# Patient Record
Sex: Female | Born: 1995 | Race: White | Hispanic: No | Marital: Single
Health system: Southern US, Community
[De-identification: ages and names within clinical notes are randomized; demographics above are authoritative.]

## PROBLEM LIST (undated history)

## (undated) DIAGNOSIS — R109 Unspecified abdominal pain: Secondary | ICD-10-CM

## (undated) DIAGNOSIS — H539 Unspecified visual disturbance: Secondary | ICD-10-CM

## (undated) DIAGNOSIS — R110 Nausea: Secondary | ICD-10-CM

## (undated) DIAGNOSIS — F329 Major depressive disorder, single episode, unspecified: Secondary | ICD-10-CM

## (undated) DIAGNOSIS — R1110 Vomiting, unspecified: Secondary | ICD-10-CM

## (undated) DIAGNOSIS — K2970 Gastritis, unspecified, without bleeding: Secondary | ICD-10-CM

## (undated) DIAGNOSIS — F32A Depression, unspecified: Secondary | ICD-10-CM

## (undated) DIAGNOSIS — E669 Obesity, unspecified: Secondary | ICD-10-CM

## (undated) HISTORY — DX: Nausea: R11.0

## (undated) HISTORY — DX: Vomiting, unspecified: R11.10

## (undated) HISTORY — DX: Morbid (severe) obesity due to excess calories: E66.01

## (undated) HISTORY — DX: Unspecified abdominal pain: R10.9

## (undated) HISTORY — PX: EYE SURGERY: SHX253

## (undated) HISTORY — PX: APPENDECTOMY: SHX54

---
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
## 2018-04-20 ENCOUNTER — Emergency Department (HOSPITAL_COMMUNITY)
Admission: EM | Admit: 2018-04-20 | Discharge: 2018-04-20 | Disposition: A | Discharge status: Home / Self Care | Payer: Self-pay | Attending: Emergency Medicine | Admitting: Emergency Medicine

## 2018-04-20 ENCOUNTER — Other Ambulatory Visit: Payer: Self-pay

## 2018-04-20 ENCOUNTER — Encounter (HOSPITAL_COMMUNITY): Payer: Self-pay | Admitting: Emergency Medicine

## 2018-04-20 ENCOUNTER — Emergency Department (HOSPITAL_COMMUNITY): Payer: Self-pay

## 2018-04-20 DIAGNOSIS — J111 Influenza due to unidentified influenza virus with other respiratory manifestations: Secondary | ICD-10-CM | POA: Insufficient documentation

## 2018-04-20 DIAGNOSIS — R69 Illness, unspecified: Secondary | ICD-10-CM

## 2018-04-20 DIAGNOSIS — Z79899 Other long term (current) drug therapy: Secondary | ICD-10-CM | POA: Insufficient documentation

## 2018-04-20 DIAGNOSIS — Z7722 Contact with and (suspected) exposure to environmental tobacco smoke (acute) (chronic): Secondary | ICD-10-CM | POA: Insufficient documentation

## 2018-04-20 MED ORDER — OSELTAMIVIR PHOSPHATE 75 MG PO CAPS: 75.0000 mg | ORAL_CAPSULE | Freq: Two times a day (BID) | ORAL | 0 refills | Status: AC

## 2018-04-20 MED ORDER — OSELTAMIVIR PHOSPHATE 75 MG PO CAPS
75.0000 mg | ORAL_CAPSULE | Freq: Once | ORAL | Status: AC
  Administered 2018-04-20: 75 mg via ORAL
  Filled 2018-04-20: qty 1

## 2018-04-20 MED ORDER — BENZONATATE 100 MG PO CAPS: 200.0000 mg | ORAL_CAPSULE | Freq: Three times a day (TID) | ORAL | 0 refills | Status: DC | PRN

## 2018-04-20 MED ORDER — BENZONATATE 100 MG PO CAPS
200.0000 mg | ORAL_CAPSULE | Freq: Three times a day (TID) | ORAL | 0 refills | Status: AC | PRN
Start: 2018-04-20 — End: ?

## 2018-04-20 NOTE — ED Triage Notes (Signed)
Cough, body aches, and chills since yesterday.

## 2018-04-20 NOTE — Discharge Instructions (Signed)
Your chest x-ray tonight is clear of any infection.  I suspect you do have the flu.  Go home and rest, make sure you are drinking plenty of fluids.  Take ibuprofen or Tylenol for fever reduction and body aches.  Take your next dose of Tamiflu tomorrow morning and take this medication twice daily until gone.  Get rechecked for any worsening symptoms.

## 2018-04-22 NOTE — ED Provider Notes (Signed)
[facility] EMERGENCY DEPARTMENT Provider Note   CSN: [id number] Arrival date & time: 04/20/18  2011     History   Chief Complaint Chief Complaint  Patient presents with  . Cough    HPI [name] is a 22 y.o. female.  The history is provided by the patient and a parent.  Cough  This is a new problem. The current episode started yesterday. The problem occurs every few minutes. The problem has not changed since onset.The cough is non-productive. The maximum temperature recorded prior to her arrival was 100 to 100.9 F (subjective fever including chills and sweats). Associated symptoms include chills, sweats and myalgias. Pertinent negatives include no chest pain, no headaches, no rhinorrhea, no sore throat, no shortness of breath and no wheezing. Treatments tried: She has taken Tylenol prior to arrival. The treatment provided mild relief. She is not a smoker. Her past medical history is significant for bronchitis. Her past medical history does not include pneumonia or asthma.    Past Medical History:  Diagnosis Date  . Abdominal pain   . Depression   . Gastritis   . Migraine   . Nausea   . Obesity   . Vision abnormalities    wears glasses  . Vomiting     Patient Active Problem List   Diagnosis Date Noted  . Problem with school attendance 10/31/2012  . Chronic headaches 10/31/2012  . Diarrhea 03/21/2012  . Nausea   . Depression 03/03/2012  . RUQ abdominal pain 02/23/2012    Past Surgical History:  Procedure Laterality Date  . APPENDECTOMY    . ESOPHAGOGASTRODUODENOSCOPY  04/01/2012   Procedure: ESOPHAGOGASTRODUODENOSCOPY (EGD);  Surgeon: Jon Gills, MD;  Location: [facility] OR;  Service: Gastroenterology;  Laterality: N/A;  . EYE SURGERY     bil .     OB History   None      Home Medications    Prior to Admission medications   Medication Sig Start Date End Date Taking? Authorizing Provider  benzonatate (TESSALON) 100 MG capsule Take 2 capsules (200 mg  total) by mouth 3 (three) times daily as needed. 04/20/18   Burgess Amor, PA-C  ibuprofen (ADVIL,MOTRIN) 200 MG tablet Take 400 mg by mouth every 6 (six) hours as needed for pain.    [provider]  medroxyPROGESTERone (DEPO-PROVERA) 150 MG/ML injection Inject 150 mg into the muscle every 3 (three) months.    [provider]  minocycline (MINOCIN,DYNACIN) 100 MG capsule Take 100 mg by mouth daily.     [provider]  oseltamivir (TAMIFLU) 75 MG capsule Take 1 capsule (75 mg total) by mouth every 12 (twelve) hours. 04/20/18   Burgess Amor, PA-C  OXcarbazepine (TRILEPTAL) 150 MG tablet Take 300 mg by mouth at bedtime. Take two capsules (300mg ) in the morning and three capsules (450 mg )at bedtime    [provider]  topiramate (TOPAMAX) 50 MG tablet Take 50-100 mg by mouth 2 (two) times daily. For migraine prevention. 1 in the morning and 2 at bedtime.    [provider]    Family History Family History  Problem Relation Age of Onset  . Diabetes Mother   . Cholelithiasis Mother   . Depression Mother   . Hypertension Mother   . Miscarriages / [place] Mother   . Diabetes Maternal Grandmother   . COPD Maternal Grandmother   . Hypertension Maternal Grandmother   . Cancer Maternal Grandfather   . COPD Maternal Grandfather   . Ulcers Maternal  Grandfather   . Pancreatitis Maternal Grandfather   . Hypertension Maternal Grandfather   . Ulcers Paternal Grandfather     Social History Social History   Tobacco Use  . Smoking status: Passive Smoke Exposure - Never Smoker  . Smokeless tobacco: Never Used  Substance Use Topics  . Alcohol use: No  . Drug use: No     Allergies   Patient has no known allergies.   Review of Systems Review of Systems  Constitutional: Positive for chills.  HENT: Negative for rhinorrhea and sore throat.   Respiratory: Positive for cough. Negative for shortness of breath and wheezing.   Cardiovascular: Negative  for chest pain.  Musculoskeletal: Positive for myalgias.  Neurological: Negative for headaches.     Physical Exam Updated Vital Signs BP (!) 158/79 (BP Location: Right Arm)   Pulse (!) 118   Temp 99.3 F (37.4 C) (Oral)   Resp 18   Ht 5\' 6"  (1.676 m)   Wt 97.1 kg   LMP 04/17/2018   SpO2 100%   BMI 34.54 kg/m   Physical Exam  Constitutional: She is oriented to person, place, and time. She appears well-developed and well-nourished.  HENT:  Head: Normocephalic and atraumatic.  Right Ear: Tympanic membrane and ear canal normal.  Left Ear: Tympanic membrane and ear canal normal.  Mouth/Throat: Uvula is midline, oropharynx is clear and moist and mucous membranes are normal. No oropharyngeal exudate, posterior oropharyngeal edema, posterior oropharyngeal erythema or tonsillar abscesses. No tonsillar exudate.  Eyes: Conjunctivae are normal.  Cardiovascular: Normal rate and normal heart sounds.  Pulmonary/Chest: Effort normal. No respiratory distress. She has no wheezes. She has no rales.  Abdominal: Soft. She exhibits no mass. There is no tenderness. There is no guarding.  Musculoskeletal: Normal range of motion.  Neurological: She is alert and oriented to person, place, and time.  Skin: Skin is warm and dry. No rash noted.  Psychiatric: She has a normal mood and affect.     ED Treatments / Results  Labs (all labs ordered are listed, but only abnormal results are displayed) Labs Reviewed - No data to display  EKG None  Radiology Dg Chest 2 View  Result Date: 04/20/2018 CLINICAL DATA:  Cough, body aches, shortness of breath EXAM: CHEST - 2 VIEW COMPARISON:  05/26/2012. FINDINGS: Heart and mediastinal contours are within normal limits. No focal opacities or effusions. No acute bony abnormality. IMPRESSION: No active cardiopulmonary disease. Electronically Signed   By: Charlett Nose M.D.   On: 04/20/2018 21:52    Procedures Procedures (including critical care  time)  Medications Ordered in ED Medications  oseltamivir (TAMIFLU) capsule 75 mg (75 mg Oral Given 04/20/18 2252)     Initial Impression / Assessment and Plan / ED Course  I have reviewed the triage vital signs and the nursing notes.  Pertinent labs & imaging results that were available during my care of the patient were reviewed by me and considered in my medical decision making (see chart for details).     Patient with flulike symptoms, negative chest x-ray, and benign exam with fairly normal vital signs, although she is tachycardic.  I suspect this is driven by her fever, she has no sign of being dehydrated this time.  Also denies chest pain or dyspnea, doubt PE.  She was encouraged to rest and drink plenty of fluids.  Strict return precautions were discussed or follow-up with PCP.  Discussed the role of Tamiflu, pros and cons.  Patient opts to start  this medication.  Final Clinical Impressions(s) / ED Diagnoses   Final diagnoses:  Influenza-like illness    ED Discharge Orders         Ordered    oseltamivir (TAMIFLU) 75 MG capsule  Every 12 hours     04/20/18 2216    benzonatate (TESSALON) 100 MG capsule  3 times daily PRN,   Status:  Discontinued     04/20/18 2228    benzonatate (TESSALON) 100 MG capsule  3 times daily PRN     04/20/18 2228           Burgess Amor, PA-C 04/22/18 0207    Terrilee Files, MD 04/22/18 1146

## 2019-09-05 ENCOUNTER — Other Ambulatory Visit: Payer: Self-pay

## 2019-09-05 ENCOUNTER — Ambulatory Visit: Payer: Medicaid Other | Attending: Internal Medicine

## 2019-09-05 DIAGNOSIS — Z20822 Contact with and (suspected) exposure to covid-19: Secondary | ICD-10-CM

## 2019-09-06 LAB — NOVEL CORONAVIRUS, NAA: SARS-CoV-2, NAA: DETECTED — AB

## 2019-09-07 ENCOUNTER — Encounter: Payer: Self-pay | Admitting: Physician Assistant

## 2019-09-07 ENCOUNTER — Telehealth: Payer: Self-pay | Admitting: Physician Assistant

## 2019-09-07 NOTE — Telephone Encounter (Signed)
Called to discuss with [name] about Covid symptoms and the use of bamlanivimab or casirivimab/imdevimab, a monoclonal antibody infusion for those with mild to moderate Covid symptoms and at a high risk of hospitalization.     Pt is qualified for this infusion at the [facility] infusion center due to co-morbid conditions (BMI>35) and/or a member of an at-risk group, however declines infusion at this time. Symptoms tier reviewed as well as criteria for ending isolation.  Symptoms reviewed that would warrant ED/Hospital evaluation. Preventative practices reviewed. Patient verbalized understanding. Patient advised to call back if he decides that he does want to get infusion. Callback number to the infusion center given. Patient advised to go to Urgent care or ED with severe symptoms. Last date pt would be eligible for infusion is 09/16/19.   Patient Active Problem List   Diagnosis Date Noted  . Problem with school attendance 10/31/2012  . Chronic headaches 10/31/2012  . Morbid obesity 03/21/2012  . Nausea   . Depression 03/03/2012  . RUQ abdominal pain 02/23/2012    Cline Crock PA-C

## 2023-04-06 ENCOUNTER — Encounter (HOSPITAL_COMMUNITY): Payer: Self-pay

## 2023-04-06 ENCOUNTER — Emergency Department (HOSPITAL_COMMUNITY)
Admission: EM | Admit: 2023-04-06 | Discharge: 2023-04-06 | Disposition: A | Discharge status: Home / Self Care | Payer: No Typology Code available for payment source | Attending: Emergency Medicine | Admitting: Emergency Medicine

## 2023-04-06 ENCOUNTER — Emergency Department (HOSPITAL_COMMUNITY): Payer: No Typology Code available for payment source

## 2023-04-06 ENCOUNTER — Other Ambulatory Visit: Payer: Self-pay

## 2023-04-06 DIAGNOSIS — M7918 Myalgia, other site: Secondary | ICD-10-CM | POA: Insufficient documentation

## 2023-04-06 DIAGNOSIS — Y92410 Unspecified street and highway as the place of occurrence of the external cause: Secondary | ICD-10-CM | POA: Diagnosis not present

## 2023-04-06 DIAGNOSIS — R079 Chest pain, unspecified: Secondary | ICD-10-CM | POA: Insufficient documentation

## 2023-04-06 LAB — URINALYSIS, ROUTINE W REFLEX MICROSCOPIC
Bilirubin Urine: NEGATIVE
Glucose, UA: NEGATIVE mg/dL
Hgb urine dipstick: NEGATIVE
Ketones, ur: NEGATIVE mg/dL
Leukocytes,Ua: NEGATIVE
Nitrite: NEGATIVE
Protein, ur: NEGATIVE mg/dL
Specific Gravity, Urine: 1.017 (ref 1.005–1.030)
pH: 6 (ref 5.0–8.0)

## 2023-04-06 LAB — PREGNANCY, URINE: Preg Test, Ur: NEGATIVE

## 2023-04-06 MED ORDER — METHOCARBAMOL 500 MG PO TABS: 1000.0000 mg | ORAL_TABLET | Freq: Three times a day (TID) | ORAL | 0 refills | Status: AC | PRN

## 2023-04-06 MED ORDER — MELOXICAM 7.5 MG PO TABS: 7.5000 mg | ORAL_TABLET | Freq: Every day | ORAL | 0 refills | Status: AC

## 2023-04-06 MED ORDER — KETOROLAC TROMETHAMINE 60 MG/2ML IM SOLN
30.0000 mg | Freq: Once | INTRAMUSCULAR | Status: AC
  Administered 2023-04-06: 30 mg via INTRAMUSCULAR
  Filled 2023-04-06: qty 2

## 2023-04-06 MED ORDER — METHOCARBAMOL 500 MG PO TABS
1000.0000 mg | ORAL_TABLET | Freq: Once | ORAL | Status: AC
  Administered 2023-04-06: 1000 mg via ORAL
  Filled 2023-04-06: qty 2

## 2023-04-06 NOTE — Discharge Instructions (Signed)
Imaging studies did not show any new injuries.  Prescriptions were sent to your pharmacy for pain medications.  Take meloxicam in place of ibuprofen.  Methocarbamol is a muscle relaxer.  Take as needed.
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
## 2023-10-04 ENCOUNTER — Encounter (HOSPITAL_COMMUNITY): Payer: Self-pay

## 2023-10-04 ENCOUNTER — Emergency Department (HOSPITAL_COMMUNITY)
Admission: EM | Admit: 2023-10-04 | Discharge: 2023-10-05 | Disposition: A | Discharge status: Home / Self Care | Payer: Worker's Compensation | Attending: Emergency Medicine | Admitting: Emergency Medicine

## 2023-10-04 ENCOUNTER — Other Ambulatory Visit: Payer: Self-pay

## 2023-10-04 DIAGNOSIS — M533 Sacrococcygeal disorders, not elsewhere classified: Secondary | ICD-10-CM | POA: Insufficient documentation

## 2023-10-04 DIAGNOSIS — Y990 Civilian activity done for income or pay: Secondary | ICD-10-CM | POA: Insufficient documentation

## 2023-10-04 DIAGNOSIS — W010XXA Fall on same level from slipping, tripping and stumbling without subsequent striking against object, initial encounter: Secondary | ICD-10-CM | POA: Insufficient documentation

## 2023-10-04 DIAGNOSIS — S300XXA Contusion of lower back and pelvis, initial encounter: Secondary | ICD-10-CM | POA: Insufficient documentation

## 2023-10-04 NOTE — ED Triage Notes (Signed)
 Pt presents to ED with c/o fall at work today, landing on back, reports lower back pain into sacrum. Pt says she hit head but did no LOC. Pt works at [employer] in [place].

## 2023-10-04 NOTE — ED Notes (Addendum)
 Marland Kitchen

## 2023-10-05 ENCOUNTER — Emergency Department (HOSPITAL_COMMUNITY): Payer: Self-pay

## 2023-10-05 LAB — PREGNANCY, URINE: Preg Test, Ur: NEGATIVE

## 2023-10-05 MED ORDER — HYDROCODONE-ACETAMINOPHEN 5-325 MG PO TABS: 1.0000 | ORAL_TABLET | Freq: Four times a day (QID) | ORAL | 0 refills | Status: AC | PRN

## 2023-10-05 MED ORDER — HYDROCODONE-ACETAMINOPHEN 5-325 MG PO TABS
2.0000 | ORAL_TABLET | Freq: Once | ORAL | Status: AC
  Administered 2023-10-05: 2 via ORAL
  Filled 2023-10-05: qty 2

## 2023-10-05 NOTE — Discharge Instructions (Signed)
 Take ibuprofen 600 mg every 6 hours as needed for pain.  Begin taking hydrocodone as prescribed as needed for pain not relieved with ibuprofen.  Follow-up with primary doctor if not improving in the next few days.

## 2023-10-05 NOTE — ED Notes (Signed)
 ED Provider at bedside.

## 2023-10-05 NOTE — ED Notes (Signed)
 Pt in xray

## 2023-10-05 NOTE — ED Provider Notes (Signed)
 [name] EMERGENCY DEPARTMENT AT [facility] Provider Note   CSN: [id number] Arrival date & time: 10/04/23  1907     History  Chief Complaint  Patient presents with   Fall    At work    [name] is a 28 y.o. female.  Patient is a 28 year old female with no significant past medical history.  Patient presenting today with complaints of low back pain.  She was walking out of the walk-in cooler at work when she slipped on a wet floor and landed on her low back/sacral region.  She complains of pain in these areas.  No radiation into her legs or bowel or bladder complaints.  She also states that she struck her head, but was not knocked unconscious and denies any headache or blurry vision.       Home Medications Prior to Admission medications   Medication Sig Start Date End Date Taking? Authorizing Provider  benzonatate (TESSALON) 100 MG capsule Take 2 capsules (200 mg total) by mouth 3 (three) times daily as needed. 04/20/18   Burgess Amor, PA-C  ibuprofen (ADVIL,MOTRIN) 200 MG tablet Take 400 mg by mouth every 6 (six) hours as needed for pain.    [provider]  medroxyPROGESTERone (DEPO-PROVERA) 150 MG/ML injection Inject 150 mg into the muscle every 3 (three) months.    [provider]  methocarbamol (ROBAXIN) 500 MG tablet Take 2 tablets (1,000 mg total) by mouth every 8 (eight) hours as needed for muscle spasms. 04/06/23   Gloris Manchester, MD  minocycline (MINOCIN,DYNACIN) 100 MG capsule Take 100 mg by mouth daily.     [provider]  oseltamivir (TAMIFLU) 75 MG capsule Take 1 capsule (75 mg total) by mouth every 12 (twelve) hours. 04/20/18   Burgess Amor, PA-C  OXcarbazepine (TRILEPTAL) 150 MG tablet Take 300 mg by mouth at bedtime. Take two capsules (300mg ) in the morning and three capsules (450 mg )at bedtime    [provider]  topiramate (TOPAMAX) 50 MG tablet Take 50-100 mg by mouth 2 (two) times daily. For migraine prevention. 1  in the morning and 2 at bedtime.    [provider]      Allergies    Patient has no known allergies.    Review of Systems   Review of Systems  All other systems reviewed and are negative.   Physical Exam Updated Vital Signs BP (!) 150/85   Pulse 80   Temp 98.5 F (36.9 C)   Resp 17   Ht 5\' 6"  (1.676 m)   Wt 108.9 kg   LMP 09/29/2023 (Exact Date)   SpO2 100%   BMI 38.74 kg/m  Physical Exam Vitals and nursing note reviewed.  Constitutional:      General: She is not in acute distress.    Appearance: Normal appearance. She is not ill-appearing.  HENT:     Head: Normocephalic and atraumatic.  Pulmonary:     Effort: Pulmonary effort is normal.  Musculoskeletal:     Comments: There is tenderness to palpation in the lower lumbar/sacral region.  There is no palpable abnormality or step-off.  Skin:    General: Skin is warm and dry.  Neurological:     General: No focal deficit present.     Mental Status: She is alert and oriented to person, place, and time.     Cranial Nerves: No cranial nerve deficit.     Motor: No weakness.     Gait: Gait normal.  Comments: Strength is 5 out of 5 in both lower extremities.  Sensation is intact throughout both lower extremities.     ED Results / Procedures / Treatments   Labs (all labs ordered are listed, but only abnormal results are displayed) Labs Reviewed  PREGNANCY, URINE    EKG None  Radiology No results found.  Procedures Procedures    Medications Ordered in ED Medications - No data to display  ED Course/ Medical Decision Making/ A&P  Patient presenting here with complaints of low back and sacral pain after a fall on a wet floor at work.  Patient arrives with stable vital signs.  Physical examination basically unremarkable.  X-rays obtained of the lumbar spine and sacrum/coccyx, both of which are negative.  Patient to be discharged with pain medication, rest, and follow-up as needed.  Final Clinical  Impression(s) / ED Diagnoses Final diagnoses:  None    Rx / DC Orders ED Discharge Orders     None         Geoffery Lyons, MD 10/05/23 [phone]
